# Patient Record
Sex: Female | Born: 2000 | Race: White | Hispanic: No | Marital: Single | Smoking: Never smoker
Health system: Southern US, Community
[De-identification: ages and names within clinical notes are randomized; demographics above are authoritative.]

## PROBLEM LIST (undated history)

## (undated) DIAGNOSIS — I498 Other specified cardiac arrhythmias: Secondary | ICD-10-CM

## (undated) DIAGNOSIS — G90A Postural orthostatic tachycardia syndrome (POTS): Secondary | ICD-10-CM

## (undated) HISTORY — DX: Postural orthostatic tachycardia syndrome (POTS): G90.A

## (undated) HISTORY — DX: Other specified cardiac arrhythmias: I49.8

---
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
## 2021-07-01 ENCOUNTER — Encounter: Payer: Self-pay | Admitting: Obstetrics

## 2021-07-01 ENCOUNTER — Ambulatory Visit: Payer: BC Managed Care – PPO | Admitting: Obstetrics

## 2021-07-01 ENCOUNTER — Other Ambulatory Visit: Payer: Self-pay

## 2021-07-01 VITALS — BP 110/70 | Ht 63.0 in | Wt 128.0 lb

## 2021-07-01 DIAGNOSIS — N912 Amenorrhea, unspecified: Secondary | ICD-10-CM | POA: Insufficient documentation

## 2021-07-01 DIAGNOSIS — Z01419 Encounter for gynecological examination (general) (routine) without abnormal findings: Secondary | ICD-10-CM | POA: Diagnosis not present

## 2021-07-01 MED ORDER — LO LOESTRIN FE 1 MG-10 MCG / 10 MCG PO TABS: 1.0000 | ORAL_TABLET | Freq: Every day | ORAL | 2 refills | Status: AC

## 2021-07-01 NOTE — Progress Notes (Signed)
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 She present today to discuss her 2.5 months of amenorrhea. She is not sexually active with men ( but with women). She has never had intercourse with a female. She started periods at age 18, and relates a hx of being trialed on low dose OCPs to regulate and address heavy bleeding. She di not sty on the pills due to nausea. Her last period was 04/21/2021. She lost her mother this Spring, as well as a best friend and her grandfather also died recently. She admits to this year being very stressful and sad for her. She is treated by Cardiology for her POTS, and she also sees a Therapist, sports for Bipolar Disorder.   Review of Systems:  Review of Systems  Constitutional: Negative.   HENT: Negative.    Eyes: Negative.   Respiratory: Negative.    Cardiovascular:        Pt has POTS  Gastrointestinal: Negative.   Genitourinary: Negative.   Musculoskeletal: Negative.   Skin: Negative.   Neurological: Negative.   Endo/Heme/Allergies: Negative.     Past Medical History:  Past Medical History:  Diagnosis Date   POTS (postural orthostatic tachycardia syndrome)     Past Surgical History:  History reviewed. No pertinent surgical history.  Gynecologic History: Patient's last menstrual period was 04/21/2021.  Obstetric History: G0P0000  Family History:  Family History  Problem Relation Age of Onset   Breast cancer Maternal Aunt    Colon cancer Paternal Grandmother    Breast cancer Maternal Great-grandmother     Social History:  Social History   Socioeconomic History   Marital status: Single    Spouse name: Not on file   Number of children: Not on file   Years of education: Not on file   Highest education level: Not on file  Occupational History    Not on file  Tobacco Use   Smoking status: Never   Smokeless tobacco: Never  Vaping Use   Vaping Use: Never used  Substance and Sexual Activity   Alcohol use: Never   Drug use: Never   Sexual activity: Yes    Birth control/protection: None  Other Topics Concern   Not on file  Social History Narrative   Not on file   Social Determinants of Health   Financial Resource Strain: Not on file  Food Insecurity: Not on file  Transportation Needs: Not on file  Physical Activity: Not on file  Stress: Not on file  Social Connections: Not on file  Intimate Partner Violence: Not on file    Allergies:  No Known Allergies  Medications: Prior to Admission medications   Medication Sig Start Date End Date Taking? Authorizing Provider  buPROPion (WELLBUTRIN) 75 MG tablet Take by mouth.   Yes [provider]  fluticasone (FLONASE) 50 MCG/ACT nasal spray 2 sprays in each nostril daily x1 week then 1-2 sprays in each nostril daily. (use smallest dose possible for symptom control after week 1). 03/22/21 03/22/22 Yes [provider]  lurasidone (LATUDA) 40 MG TABS tablet Take by mouth. 06/18/21  Yes [provider]  propranolol (INDERAL) 10 MG tablet Take by mouth. 11/06/20 06/06/22 Yes [provider]    Physical Exam Vitals:  Vitals:   07/01/21 0951  BP:  110/70   Patient's last menstrual period was 04/21/2021.  Physical Exam Constitutional:      Appearance: Normal appearance. She is normal weight.  HENT:     Head: Normocephalic and atraumatic.  Cardiovascular:     Rate and Rhythm: Normal rate and regular rhythm.  Pulmonary:     Effort: Pulmonary effort is normal.     Breath sounds: Normal breath sounds.  Abdominal:     General: Abdomen is flat.     Palpations: Abdomen is soft.  Genitourinary:    General: Normal vulva.     Comments: Shaves mons, Uterus is anteverted, non enlarged and midline. No pelvic tenderness or adnexal  enlargement. Musculoskeletal:        General: Normal range of motion.     Cervical back: Normal range of motion and neck supple.  Skin:    General: Skin is warm and dry.  Neurological:     General: No focal deficit present.     Mental Status: She is alert and oriented to person, place, and time.  Psychiatric:        Mood and Affect: Mood normal.        Behavior: Behavior normal.     Assessment: 20 y.o. G0P0000 No problem-specific Assessment & Plan notes found for this encounter.   Plan: Problem List Items Addressed This Visit       Other   Amenorrhea   Relevant Medications   Norethindrone-Ethinyl Estradiol-Fe Biphas (LO LOESTRIN FE) 1 MG-10 MCG / 10 MCG tablet   Other Visit Diagnoses     Women's annual routine gynecological examination    -  Primary     Discussed the option of low dosage OCPs to address cycles and dysmenorrhea or to use progesterone to bring on a period. We have mutually decided to start her on Junel, with a plan to RTC in 3 months to see how she responds.  Linda Gonzales, CNM  07/01/2021 1:33 PM

## 2021-09-17 ENCOUNTER — Ambulatory Visit: Payer: BC Managed Care – PPO | Admitting: Obstetrics
